# Patient Record
Sex: Female | Born: 1955 | Race: Black or African American | Hispanic: No
Health system: Southern US, Community
[De-identification: ages and names within clinical notes are randomized; demographics above are authoritative.]

---

## 2016-01-15 ENCOUNTER — Encounter: Payer: Self-pay | Admitting: *Deleted

## 2016-01-15 ENCOUNTER — Ambulatory Visit: Payer: Self-pay | Attending: Oncology | Admitting: *Deleted

## 2016-01-15 ENCOUNTER — Ambulatory Visit
Admission: RE | Admit: 2016-01-15 | Discharge: 2016-01-15 | Disposition: A | Discharge status: Home / Self Care | Payer: Self-pay | Source: Ambulatory Visit | Attending: Oncology | Admitting: Oncology

## 2016-01-15 VITALS — BP 119/82 | HR 54 | Temp 98.3°F | Resp 20 | Ht 62.99 in | Wt 182.7 lb

## 2016-01-15 DIAGNOSIS — Z0000 Encounter for general adult medical examination without abnormal findings: Secondary | ICD-10-CM

## 2016-01-15 NOTE — Patient Instructions (Signed)
Gave patient hand-out, Women Staying Healthy, Active and Well from [facility], with education on breast health, pap smears, heart and colon health. 

## 2016-01-15 NOTE — Progress Notes (Signed)
Subjective:     Patient ID: Wendy Vance, female   DOB: 1956/11/07, 60 y.o.   MRN: [id number]  HPI   Review of Systems     Objective:   Physical Exam  Pulmonary/Chest: Right breast exhibits no inverted nipple, no mass, no nipple discharge, no skin change and no tenderness. Left breast exhibits no inverted nipple, no mass, no nipple discharge, no skin change and no tenderness. Breasts are symmetrical.         Assessment:     60 year old Black female presents to [facility] for clinical breast exam and mammogram.  Clinical breast exam unremarkable.  Taught self breast awareness.  Patient has been screened for eligibility.  She does not have any insurance, Medicare or Medicaid.  She also meets financial eligibility.  Hand-out given on the Affordable Care Act.     Plan:     Screening mammogram ordered.  Will follow up per protocol.

## 2016-01-21 ENCOUNTER — Encounter: Payer: Self-pay | Admitting: *Deleted

## 2016-01-21 NOTE — Progress Notes (Signed)
Letter mailed from the [facility] to inform patient of her normal mammogram results.  Patient is to follow-up with annual screening in one year.  HSIS to Christy. 

## 2017-01-18 ENCOUNTER — Ambulatory Visit: Payer: Self-pay

## 2017-02-08 ENCOUNTER — Encounter (INDEPENDENT_AMBULATORY_CARE_PROVIDER_SITE_OTHER): Payer: Self-pay

## 2017-02-08 ENCOUNTER — Encounter: Payer: Self-pay | Admitting: *Deleted

## 2017-02-08 ENCOUNTER — Ambulatory Visit
Admission: RE | Admit: 2017-02-08 | Discharge: 2017-02-08 | Disposition: A | Discharge status: Home / Self Care | Payer: Self-pay | Source: Ambulatory Visit | Attending: Oncology | Admitting: Oncology

## 2017-02-08 ENCOUNTER — Ambulatory Visit: Payer: Self-pay | Attending: Oncology | Admitting: *Deleted

## 2017-02-08 VITALS — BP 183/118 | HR 69 | Temp 97.9°F | Ht 64.96 in | Wt 188.7 lb

## 2017-02-08 DIAGNOSIS — Z0000 Encounter for general adult medical examination without abnormal findings: Secondary | ICD-10-CM

## 2017-02-08 NOTE — Progress Notes (Signed)
Subjective:     Patient ID: Wendy Vance, female   DOB: Nov 02, 1956, 61 y.o.   MRN: [id number]  HPI   Review of Systems     Objective:   Physical Exam  Pulmonary/Chest: Right breast exhibits no inverted nipple, no mass, no nipple discharge, no skin change and no tenderness. Left breast exhibits no inverted nipple, no mass, no nipple discharge, no skin change and no tenderness. Breasts are symmetrical.       Assessment:     61 [facility]  Clinical breast exam unremarkable.  Taught self breast awareness.  Blood pressure elevated at 183/118.  She is to take her blood pressure meds as soon as possible and recheck her blood pressure at [facility] or [facility], and if remains higher than 140/90 she is to follow-up with her primary care provider.  Reviewed signs and symptoms of uncontrolled hypertension.  Patient states she has not had her medicine or has she been taking it regularly.  Hand out on hypertention given to patient.  Patient has been screened for eligibility.  She does not have any insurance, Medicare or Medicaid.  She also meets financial eligibility.  Hand-out given on the Affordable Care Act.    Plan:     Screening mammogram ordered.  Will follow-up per [facility] protocol.

## 2017-02-08 NOTE — Patient Instructions (Signed)

## 2017-02-23 ENCOUNTER — Encounter: Payer: Self-pay | Admitting: *Deleted

## 2017-02-23 NOTE — Progress Notes (Signed)
Letter mailed from the [facility] to inform patient of her normal mammogram results.  Patient is to follow-up with annual screening in one year.  HSIS to Christy. 

## 2018-05-18 ENCOUNTER — Ambulatory Visit: Payer: Self-pay

## 2018-07-06 ENCOUNTER — Encounter: Payer: Self-pay | Admitting: *Deleted

## 2018-07-06 ENCOUNTER — Ambulatory Visit: Payer: Self-pay | Attending: Oncology | Admitting: *Deleted

## 2018-07-06 VITALS — BP 157/96 | HR 51 | Temp 96.6°F | Resp 18 | Ht 65.0 in | Wt 185.0 lb

## 2018-07-06 DIAGNOSIS — N63 Unspecified lump in unspecified breast: Secondary | ICD-10-CM

## 2018-07-06 NOTE — Patient Instructions (Signed)
Gave patient hand-out, Women Staying Healthy, Active and Well from [facility], with education on breast health, pap smears, heart and colon health. 

## 2018-07-06 NOTE — Progress Notes (Addendum)
  Subjective:     Patient ID: Wendy Vance, female   DOB: 04-Jul-1956, 62 y.o.   MRN: [id number]  HPI   Review of Systems     Objective:   Physical Exam  Pulmonary/Chest:         Assessment:     [name].  Clinical breast exam with tiny <0.5 cm nodule at 1:00 left breast 5 cm from the nipple.  Taught self breast awareness.  [place]  Explained that since we don't have the results of her pap, we will need to do one next year, since [facility] guidelines recommend every 3 to 5 years based on the results.  She is agreeable.  Patient has been screened for eligibility.  She does not have any insurance, Medicare or Medicaid.  She also meets financial eligibility.  Hand-out given on the Affordable Care Act.  Risk Assessment    Risk Scores      07/06/2018   Last edited by: Lenn SinkWalker, Christine A, RN   5-year risk: 1.6 %   Lifetime risk: 6.3 %            Plan:     Bilateral diagnostic mammogram and ultrasound ordered.  Will follow-up per [facility] protocol.

## 2018-07-12 ENCOUNTER — Ambulatory Visit
Admission: RE | Admit: 2018-07-12 | Discharge: 2018-07-12 | Disposition: A | Discharge status: Home / Self Care | Payer: Self-pay | Source: Ambulatory Visit | Attending: Oncology | Admitting: Oncology

## 2018-07-12 DIAGNOSIS — N63 Unspecified lump in unspecified breast: Secondary | ICD-10-CM

## 2018-07-13 ENCOUNTER — Encounter: Payer: Self-pay | Admitting: *Deleted

## 2018-07-13 NOTE — Progress Notes (Addendum)
Patient with birads 1 mammogram results.  Left patient a message to return my call.  I would like to discuss her results and option to return for repeat breast exam or surgical consult for the palpable nodule.  Patient returned my call.  Discussed results and offered for the patient to return for repeat breast exam.  Patient states that "I am comfortable with what the radiologist said.  I don't think I need to come back for anything.  I've had this before."  Encouraged patient to call she notices any changes.  She is agreeable.  She is to return to routine screening in 1 year.

## 2019-05-03 IMAGING — MG MM DIGITAL DIAGNOSTIC BILAT W/ TOMO W/ CAD
8 series · 8 of 24 positions shown · non-contrast
Comparison: Previous exam(s).

CLINICAL DATA: 0.5 cm nodule felt in the 1 o'clock position of the
left breast, 5 cm from the nipple, on recent physical
examination.The patient cannot feel a mass today.

EXAM:
DIGITAL DIAGNOSTIC BILATERAL MAMMOGRAM WITH CAD AND TOMO
ULTRASOUND LEFT BREAST

[R CC synth-2D]
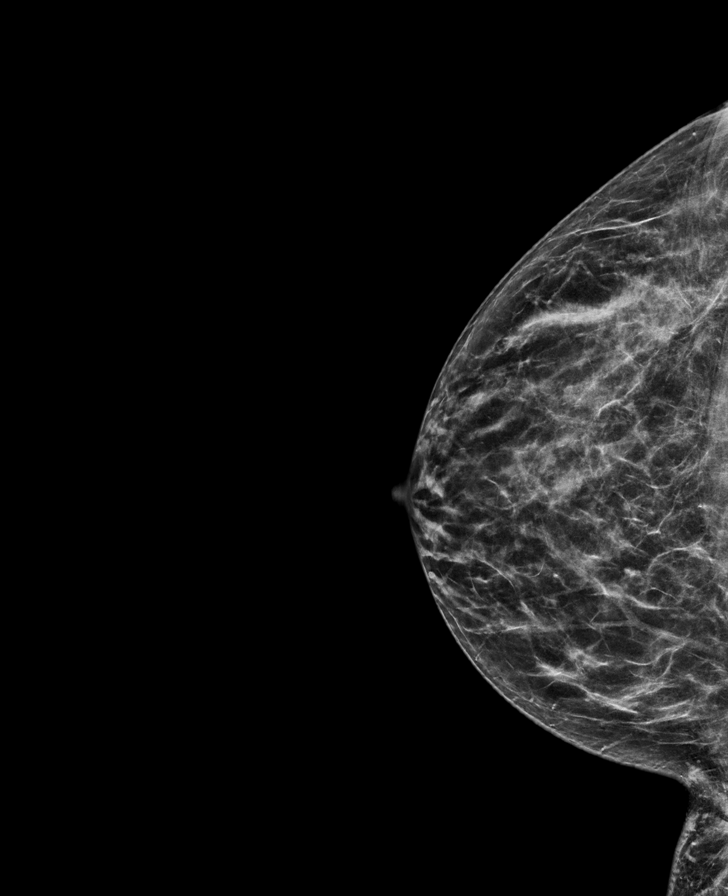

[L CC synth-2D]
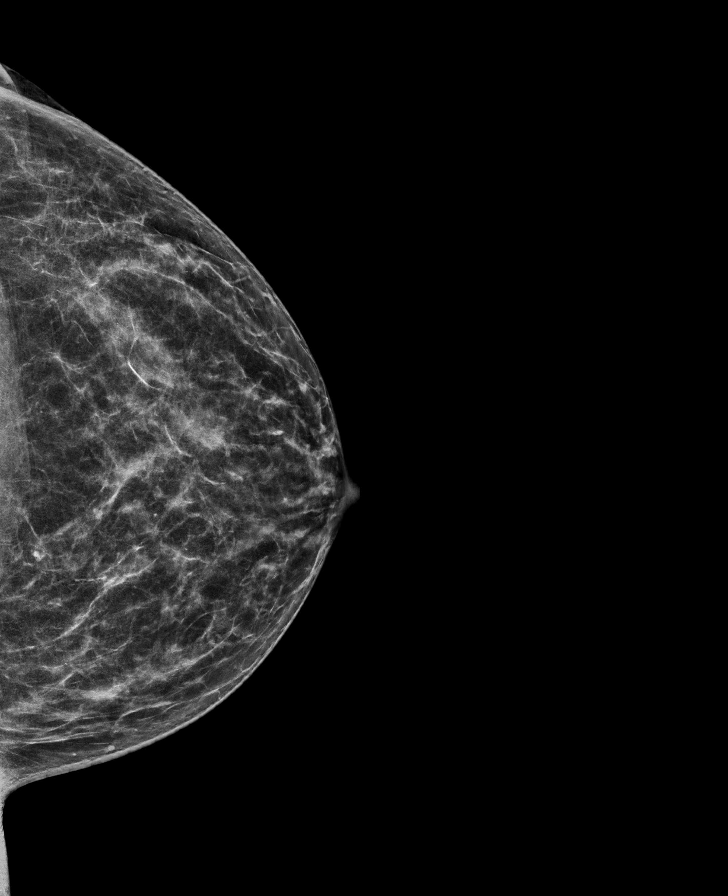

[L MLO synth-2D]
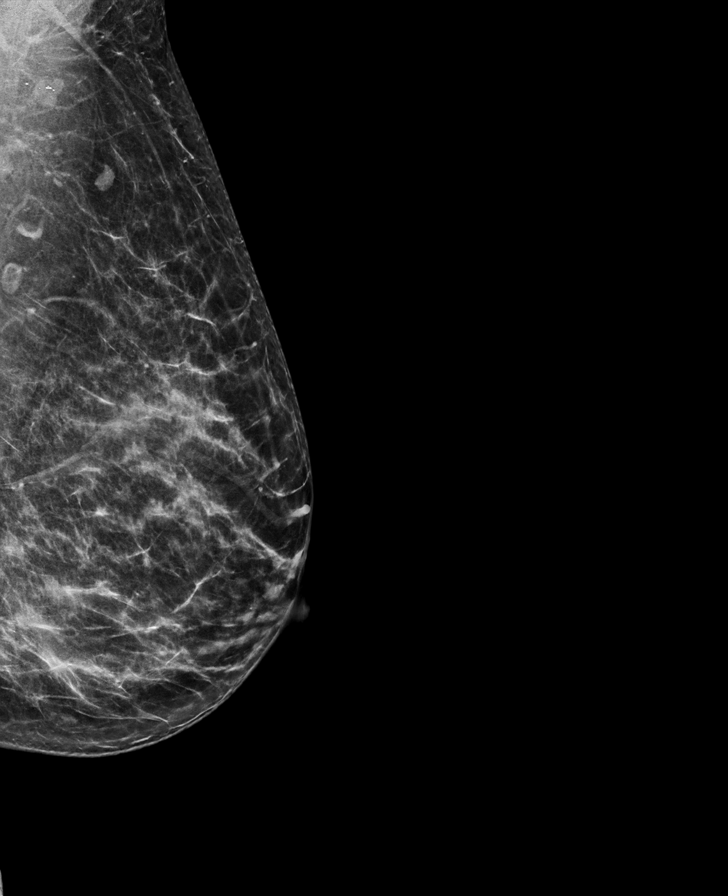

[R MLO synth-2D]
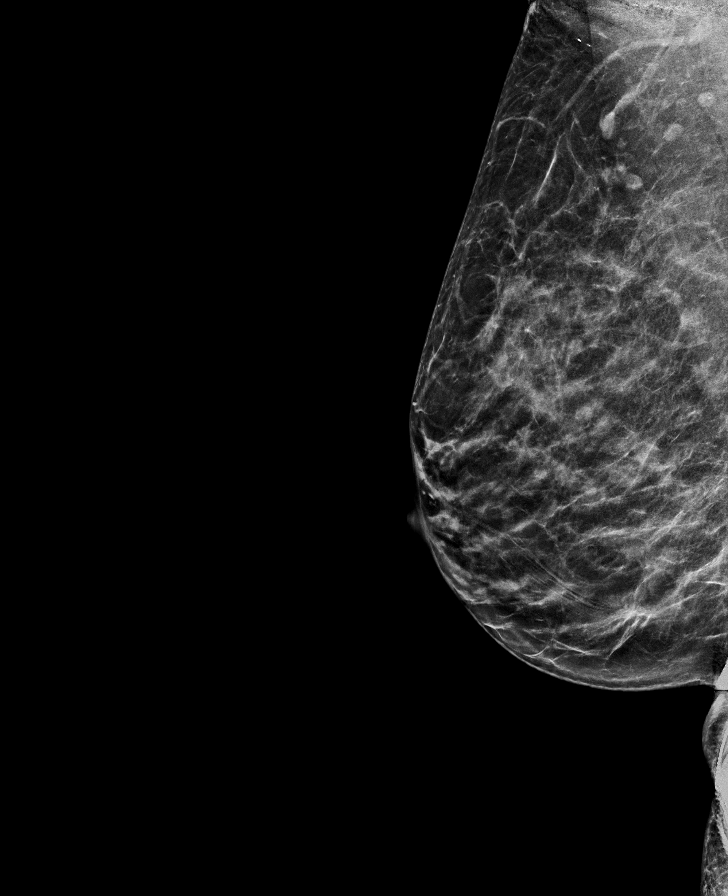

[R CC tomo · tomo slice 31/62.0]
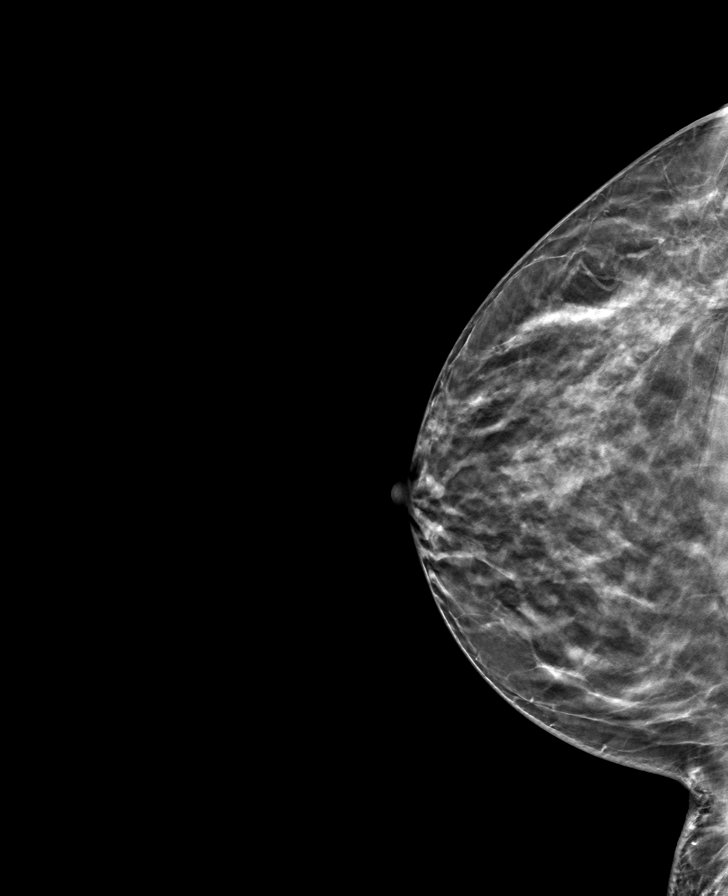

[L MLO tomo · tomo slice 31/61.0]
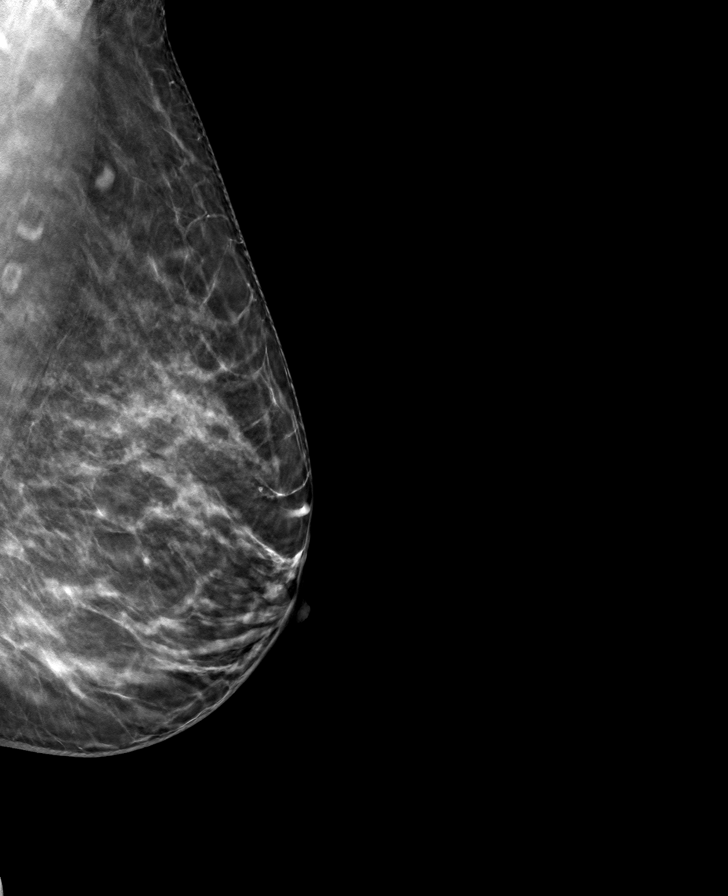

[L CC tomo · tomo slice 31/61.0]
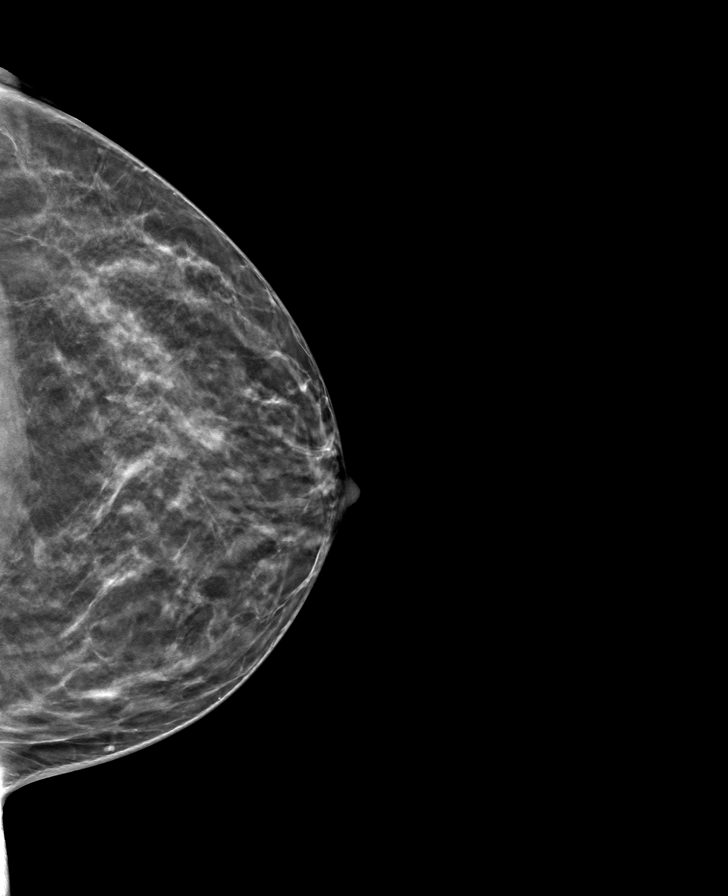

[R MLO tomo · tomo slice 31/60.0]
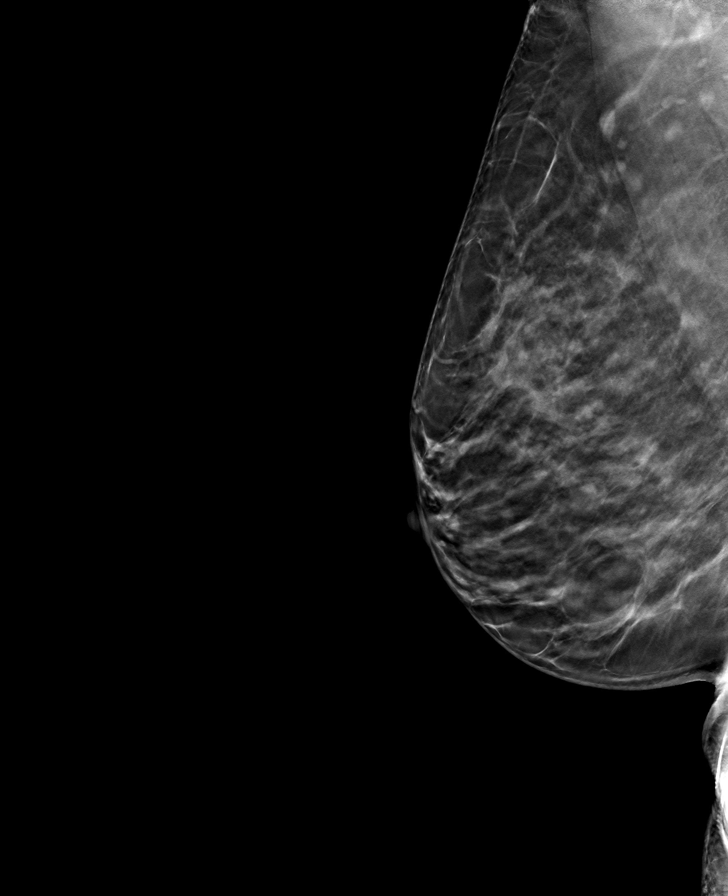

[8 of 24 positions shown; findings below may reference images not displayed]

ACR Breast Density Category c: The breast tissue is heterogeneously
dense, which may obscure small masses.
FINDINGS: Stable mammographic appearance of the breasts with no mass or other
findings suspicious for malignancy in either breast.

Mammographic images were processed with CAD.

On physical exam, there is an approximately 5 mm oval area of mild,
soft nodularity in the 1 o'clock position of the left breast, 5 cm
from the nipple.

Targeted ultrasound is performed, showing normal appearing breast
tissue throughout the upper outer left breast, including dense
glandular tissue with a convex anterior margin in the 1 o'clock
position, 5 cm from the nipple, corresponding to the palpable area
of nodularity. No mass is seen.
IMPRESSION: No evidence of malignancy. The area of palpable concern on the left
is normal dense glandular tissue.

RECOMMENDATION:
Bilateral screening mammogram in 1 year.

I have discussed the findings and recommendations with the patient.
Results were also provided in writing at the conclusion of the
visit. If applicable, a reminder letter will be sent to the patient
regarding the next appointment.

BI-RADS CATEGORY  1: Negative.

## 2023-06-15 ENCOUNTER — Other Ambulatory Visit: Payer: Self-pay | Admitting: *Deleted

## 2023-06-15 ENCOUNTER — Inpatient Hospital Stay
Admission: RE | Admit: 2023-06-15 | Discharge: 2023-06-15 | Disposition: A | Discharge status: Home / Self Care | Payer: Self-pay | Source: Ambulatory Visit | Attending: Physician Assistant | Admitting: Physician Assistant

## 2023-06-15 ENCOUNTER — Other Ambulatory Visit: Payer: Self-pay | Admitting: Family Medicine

## 2023-06-15 DIAGNOSIS — Z1231 Encounter for screening mammogram for malignant neoplasm of breast: Secondary | ICD-10-CM

## 2023-07-22 ENCOUNTER — Ambulatory Visit
Admission: RE | Admit: 2023-07-22 | Discharge: 2023-07-22 | Disposition: A | Discharge status: Home / Self Care | Payer: Medicare HMO | Source: Ambulatory Visit | Attending: Family Medicine | Admitting: Family Medicine

## 2023-07-22 ENCOUNTER — Other Ambulatory Visit: Payer: Self-pay | Admitting: Family Medicine

## 2023-07-22 DIAGNOSIS — Z1231 Encounter for screening mammogram for malignant neoplasm of breast: Secondary | ICD-10-CM

## 2023-08-23 ENCOUNTER — Ambulatory Visit
Admission: RE | Admit: 2023-08-23 | Discharge: 2023-08-23 | Disposition: A | Discharge status: Home / Self Care | Payer: Medicare HMO | Source: Ambulatory Visit | Attending: Family Medicine | Admitting: Family Medicine

## 2023-08-23 DIAGNOSIS — Z1231 Encounter for screening mammogram for malignant neoplasm of breast: Secondary | ICD-10-CM | POA: Diagnosis present

## 2024-07-10 ENCOUNTER — Other Ambulatory Visit: Payer: Self-pay | Admitting: Physician Assistant

## 2024-07-10 DIAGNOSIS — Z1231 Encounter for screening mammogram for malignant neoplasm of breast: Secondary | ICD-10-CM

## 2024-08-23 ENCOUNTER — Ambulatory Visit
Admission: RE | Admit: 2024-08-23 | Discharge: 2024-08-23 | Disposition: A | Discharge status: Home / Self Care | Source: Ambulatory Visit | Attending: Physician Assistant | Admitting: Physician Assistant

## 2024-08-23 DIAGNOSIS — Z1231 Encounter for screening mammogram for malignant neoplasm of breast: Secondary | ICD-10-CM | POA: Insufficient documentation
